# Patient Record
Sex: Male | Born: 1980 | Race: Black or African American | Hispanic: No | Smoking: Current every day smoker
Health system: Southern US, Community
[De-identification: ages and names within clinical notes are randomized; demographics above are authoritative.]

---

## 2008-12-09 ENCOUNTER — Emergency Department (HOSPITAL_COMMUNITY): Admission: EM | Admit: 2008-12-09 | Discharge: 2008-12-09 | Payer: Self-pay | Admitting: Emergency Medicine

## 2009-04-01 ENCOUNTER — Emergency Department (HOSPITAL_COMMUNITY): Admission: EM | Admit: 2009-04-01 | Discharge: 2009-04-01 | Payer: Self-pay | Admitting: Family Medicine

## 2010-01-08 ENCOUNTER — Emergency Department (HOSPITAL_COMMUNITY): Admission: EM | Admit: 2010-01-08 | Discharge: 2010-01-08 | Payer: Self-pay | Admitting: Family Medicine

## 2010-03-22 ENCOUNTER — Emergency Department (HOSPITAL_COMMUNITY): Admission: EM | Admit: 2010-03-22 | Discharge: 2010-03-22 | Payer: Self-pay | Admitting: Emergency Medicine

## 2010-08-27 ENCOUNTER — Inpatient Hospital Stay (HOSPITAL_COMMUNITY)
Admission: RE | Admit: 2010-08-27 | Discharge: 2010-08-27 | Disposition: A | Discharge status: Home / Self Care | Payer: Self-pay | Source: Ambulatory Visit | Attending: Emergency Medicine | Admitting: Emergency Medicine

## 2010-08-28 ENCOUNTER — Ambulatory Visit (INDEPENDENT_AMBULATORY_CARE_PROVIDER_SITE_OTHER): Payer: Self-pay

## 2010-08-28 ENCOUNTER — Inpatient Hospital Stay (INDEPENDENT_AMBULATORY_CARE_PROVIDER_SITE_OTHER)
Admission: RE | Admit: 2010-08-28 | Discharge: 2010-08-28 | Disposition: A | Discharge status: Home / Self Care | Payer: Self-pay | Source: Ambulatory Visit

## 2010-08-28 DIAGNOSIS — S9030XA Contusion of unspecified foot, initial encounter: Secondary | ICD-10-CM

## 2010-08-28 DIAGNOSIS — IMO0002 Reserved for concepts with insufficient information to code with codable children: Secondary | ICD-10-CM

## 2010-08-28 DIAGNOSIS — S335XXA Sprain of ligaments of lumbar spine, initial encounter: Secondary | ICD-10-CM

## 2010-12-02 ENCOUNTER — Emergency Department (HOSPITAL_COMMUNITY)
Admission: EM | Admit: 2010-12-02 | Discharge: 2010-12-03 | Disposition: A | Discharge status: Home / Self Care | Payer: Self-pay | Attending: Emergency Medicine | Admitting: Emergency Medicine

## 2010-12-02 DIAGNOSIS — K089 Disorder of teeth and supporting structures, unspecified: Secondary | ICD-10-CM | POA: Insufficient documentation

## 2010-12-02 DIAGNOSIS — K029 Dental caries, unspecified: Secondary | ICD-10-CM | POA: Insufficient documentation

## 2010-12-02 DIAGNOSIS — K047 Periapical abscess without sinus: Secondary | ICD-10-CM | POA: Insufficient documentation

## 2011-11-07 IMAGING — CR DG FOOT COMPLETE 3+V*R*
3 series · 3 of 3 positions shown · non-contrast
Comparison: None

CLINICAL DATA: Injured right foot.

RIGHT FOOT COMPLETE - 3+ VIEW

[view not recorded (1 of 3)]
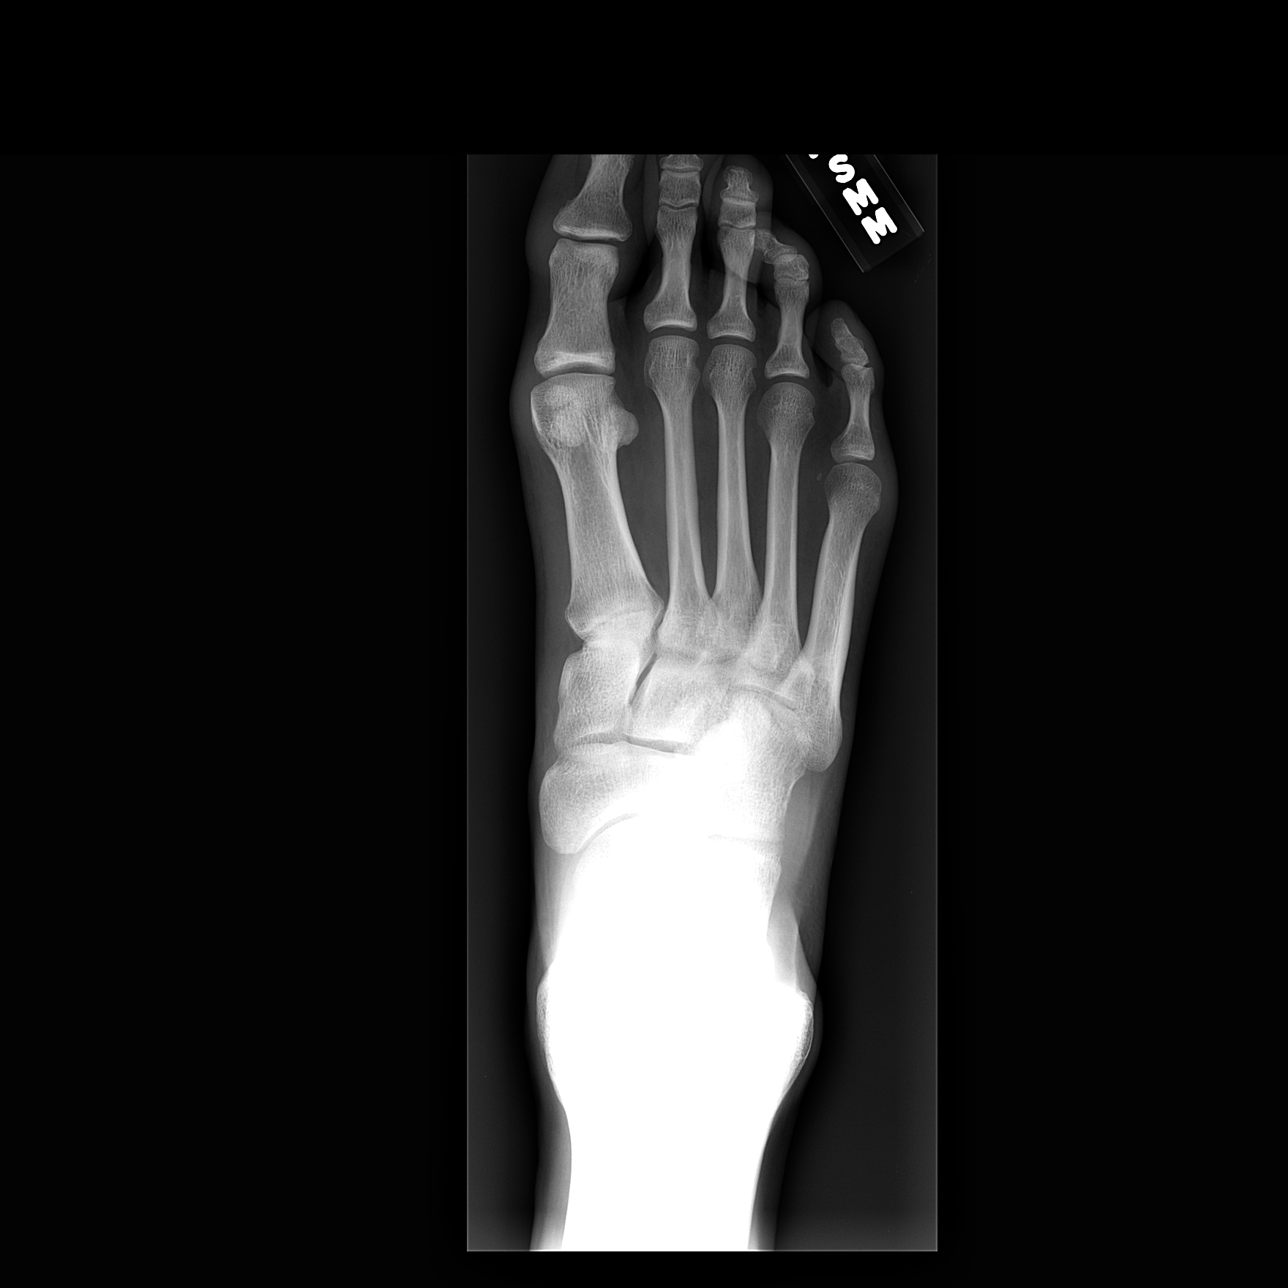

[view not recorded (2 of 3)]
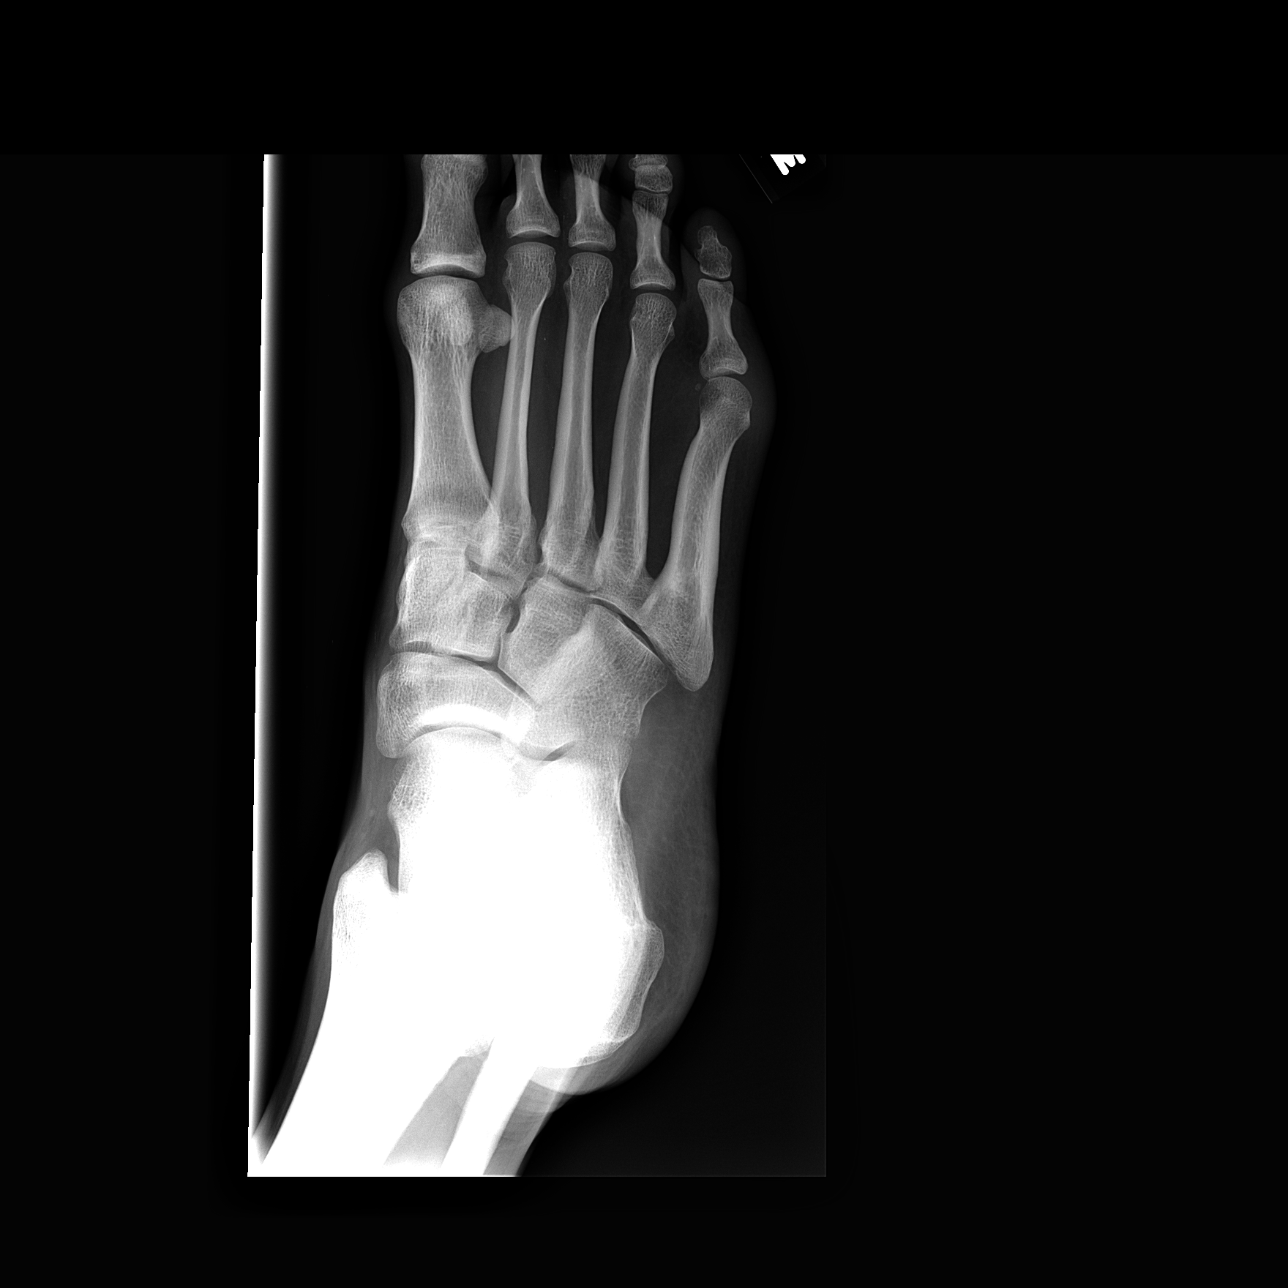

[view not recorded (3 of 3)]
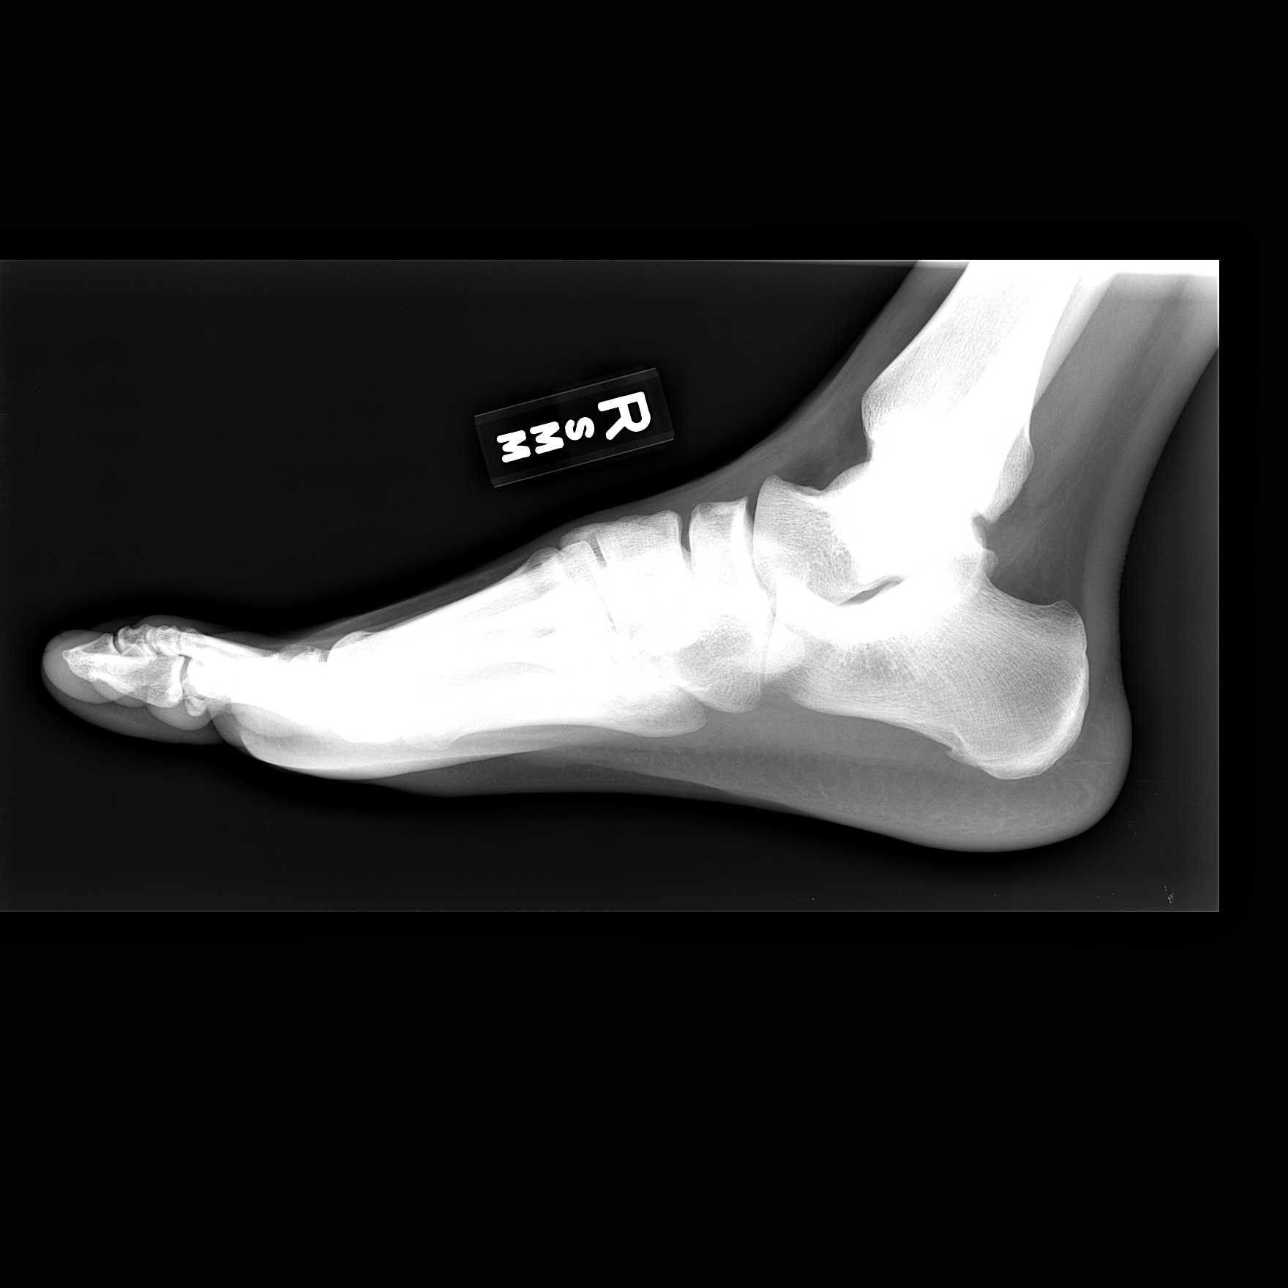

[3 of 3 positions shown; findings below may reference images not displayed]

FINDINGS: The joint spaces are maintained.  No acute fracture.
IMPRESSION: No acute bony findings.

## 2016-06-28 ENCOUNTER — Encounter (HOSPITAL_COMMUNITY): Payer: Self-pay | Admitting: *Deleted

## 2016-06-28 ENCOUNTER — Emergency Department (HOSPITAL_COMMUNITY)
Admission: EM | Admit: 2016-06-28 | Discharge: 2016-06-28 | Disposition: A | Discharge status: Home / Self Care | Payer: BLUE CROSS/BLUE SHIELD | Attending: Emergency Medicine | Admitting: Emergency Medicine

## 2016-06-28 DIAGNOSIS — Y990 Civilian activity done for income or pay: Secondary | ICD-10-CM | POA: Diagnosis not present

## 2016-06-28 DIAGNOSIS — Y939 Activity, unspecified: Secondary | ICD-10-CM | POA: Insufficient documentation

## 2016-06-28 DIAGNOSIS — Y9259 Other trade areas as the place of occurrence of the external cause: Secondary | ICD-10-CM | POA: Insufficient documentation

## 2016-06-28 DIAGNOSIS — F17290 Nicotine dependence, other tobacco product, uncomplicated: Secondary | ICD-10-CM | POA: Insufficient documentation

## 2016-06-28 DIAGNOSIS — Z79899 Other long term (current) drug therapy: Secondary | ICD-10-CM | POA: Diagnosis not present

## 2016-06-28 DIAGNOSIS — M546 Pain in thoracic spine: Secondary | ICD-10-CM | POA: Insufficient documentation

## 2016-06-28 DIAGNOSIS — X500XXA Overexertion from strenuous movement or load, initial encounter: Secondary | ICD-10-CM | POA: Diagnosis not present

## 2016-06-28 MED ORDER — NAPROXEN 500 MG PO TABS: 500.0000 mg | ORAL_TABLET | Freq: Two times a day (BID) | ORAL | 0 refills | Status: AC

## 2016-06-28 MED ORDER — KETOROLAC TROMETHAMINE 60 MG/2ML IM SOLN
60.0000 mg | Freq: Once | INTRAMUSCULAR | Status: AC
  Administered 2016-06-28: 60 mg via INTRAMUSCULAR
  Filled 2016-06-28: qty 2

## 2016-06-28 MED ORDER — METHOCARBAMOL 500 MG PO TABS: 500.0000 mg | ORAL_TABLET | Freq: Two times a day (BID) | ORAL | 0 refills | Status: AC

## 2016-06-28 MED ORDER — LIDOCAINE 5 % EX PTCH: 1.0000 | MEDICATED_PATCH | CUTANEOUS | 0 refills | Status: AC

## 2016-06-28 NOTE — ED Triage Notes (Signed)
Pt sts he is working physically demanding job which involves a lot of heavy lifting and he thinks he may have pulled a muscle. He c/o pain to right mid back radiating to lower back

## 2016-06-28 NOTE — Discharge Instructions (Signed)
Expect your soreness to increase over the next 2-3 days. Take it easy, but do not lay around too much as this may make the stiffness worse. Take 500 mg of naproxen every 12 hours or 800 mg of ibuprofen every 8 hours for the next 3 days. Take these medications with food to avoid upset stomach. Robaxin is a muscle relaxer and may help loosen stiff muscles. Do not take the Robaxin while driving or performing other dangerous activities. Be sure to perform the attached exercises starting with three times a week and working up to performing them daily. This is an essential part of preventing long term problems. Follow up with a primary care provider for any future management of these complaints. °

## 2016-06-28 NOTE — ED Provider Notes (Signed)
[facility]-EMERGENCY DEPT Provider Note   CSN: [id number] Arrival date & time: 06/28/16  1041  By signing my name below, I, Placido SouLogan Joldersma, attest that this documentation has been prepared under the direction and in the presence of Aamori Mcmasters C. Karry Causer, PA-C. Electronically Signed: Placido SouLogan Joldersma, ED Scribe. 06/28/16. 11:12 AM.   History   Chief Complaint Chief Complaint  Patient presents with  . Back Pain   HPI HPI Comments: Clayton Palmer is a 35 y.o. male who presents to the Emergency Department complaining of constant, moderate, gradual onset, right thoracic back pain x 1 week. Pt states he works in a [employer] environment doing heavy lifting throughout the day. His pain worsens with movement and when doing heavy lifting. He has taken ibuprofen prn w/o significant relief and additionally took a muscle relaxer last night which provided mild relief. Denies falls, neuro deficits, or any other complaints.     The history is provided by the patient and medical records. No language interpreter was used.    History reviewed. No pertinent past medical history.  There are no active problems to display for this patient.   History reviewed. No pertinent surgical history.     Home Medications    Prior to Admission medications   Medication Sig Start Date End Date Taking? Authorizing Provider  lidocaine (LIDODERM) 5 % Place 1 patch onto the skin daily. Remove & Discard patch within 12 hours or as directed by MD 06/28/16   Anselm PancoastShawn C Jasneet Schobert, PA-C  methocarbamol (ROBAXIN) 500 MG tablet Take 1 tablet (500 mg total) by mouth 2 (two) times daily. 06/28/16   Meeah Totino C Kinslea Frances, PA-C  naproxen (NAPROSYN) 500 MG tablet Take 1 tablet (500 mg total) by mouth 2 (two) times daily. 06/28/16   Anselm PancoastShawn C Christy Ehrsam, PA-C    Family History No family history on file.  Social History Social History  Substance Use Topics  . Smoking status: Current Every Day Smoker    Types: Cigars  . Smokeless tobacco: Never Used  . Alcohol  use Yes     Allergies   Patient has no allergy information on record.   Review of Systems Review of Systems  Musculoskeletal: Positive for back pain.  Skin: Negative for color change and wound.  Neurological: Negative for weakness and numbness.   Physical Exam Updated Vital Signs BP 130/88   Pulse 78   Temp 98.2 F (36.8 C) (Oral)   Resp 16   SpO2 99%   Physical Exam  Constitutional: He appears well-developed and well-nourished. No distress.  HENT:  Head: Normocephalic and atraumatic.  Eyes: Conjunctivae are normal.  Neck: Neck supple.  Cardiovascular: Normal rate and regular rhythm.   Pulmonary/Chest: Effort normal.  Musculoskeletal: He exhibits tenderness.  Tenderness to the right thoracic musculature around T4-T6 region. Normal motor function intact in all extremities and spine. No midline spinal tenderness.    Neurological: He is alert. He has normal strength. No sensory deficit.  No sensory deficits. Strength 5/5 in all extremities. No gait disturbance. Coordination intact.   Skin: Skin is warm and dry. He is not diaphoretic.  Psychiatric: He has a normal mood and affect. His behavior is normal.  Nursing note and vitals reviewed.  ED Treatments / Results  Labs (all labs ordered are listed, but only abnormal results are displayed) Labs Reviewed - No data to display  EKG  EKG Interpretation None       Radiology No results found.  Procedures Procedures  COORDINATION OF CARE: 11:11 AM Discussed  next steps with pt. Pt verbalized understanding and is agreeable with the plan.    Medications Ordered in ED Medications  ketorolac (TORADOL) injection 60 mg (60 mg Intramuscular Given 06/28/16 1134)     Initial Impression / Assessment and Plan / ED Course  I have reviewed the triage vital signs and the nursing notes.  Pertinent labs & imaging results that were available during my care of the patient were reviewed by me and considered in my medical decision  making (see chart for details).  Clinical Course     Patient presents with back pain consistent with muscular strain. He has no neuro or functional deficits. Home care and return precautions discussed.  I personally performed the services described in this documentation, which was scribed in my presence. The recorded information has been reviewed and is accurate.  Final Clinical Impressions(s) / ED Diagnoses   Final diagnoses:  Acute right-sided thoracic back pain    New Prescriptions Discharge Medication List as of 06/28/2016 11:17 AM    START taking these medications   Details  lidocaine (LIDODERM) 5 % Place 1 patch onto the skin daily. Remove & Discard patch within 12 hours or as directed by MD, Starting Mon 06/28/2016, Print    methocarbamol (ROBAXIN) 500 MG tablet Take 1 tablet (500 mg total) by mouth 2 (two) times daily., Starting Mon 06/28/2016, Print    naproxen (NAPROSYN) 500 MG tablet Take 1 tablet (500 mg total) by mouth 2 (two) times daily., Starting Mon 06/28/2016, Print         Anselm PancoastShawn C Mesiah Manzo, PA-C 06/29/16 1934    Lorre NickAnthony Allen, MD 06/29/16 [phone]

## 2021-09-17 ENCOUNTER — Emergency Department (HOSPITAL_COMMUNITY)
Admission: EM | Admit: 2021-09-17 | Discharge: 2021-09-17 | Disposition: A | Discharge status: Home / Self Care | Payer: BLUE CROSS/BLUE SHIELD | Attending: Emergency Medicine | Admitting: Emergency Medicine

## 2021-09-17 ENCOUNTER — Encounter (HOSPITAL_COMMUNITY): Payer: Self-pay | Admitting: Emergency Medicine

## 2021-09-17 DIAGNOSIS — H1132 Conjunctival hemorrhage, left eye: Secondary | ICD-10-CM | POA: Insufficient documentation

## 2021-09-17 MED ORDER — FLUORESCEIN SODIUM 1 MG OP STRP
1.0000 | ORAL_STRIP | Freq: Once | OPHTHALMIC | Status: AC
  Administered 2021-09-17: 21:00:00 1 via OPHTHALMIC
  Filled 2021-09-17: qty 1

## 2021-09-17 MED ORDER — OLOPATADINE HCL 0.1 % OP SOLN: 1.0000 [drp] | Freq: Two times a day (BID) | OPHTHALMIC | 0 refills | Status: AC

## 2021-09-17 MED ORDER — TETRACAINE HCL 0.5 % OP SOLN
2.0000 [drp] | Freq: Once | OPHTHALMIC | Status: AC
  Administered 2021-09-17: 21:00:00 2 [drp] via OPHTHALMIC
  Filled 2021-09-17: qty 4

## 2021-09-17 NOTE — ED Provider Notes (Signed)
?[facility] DEPT ?Provider Note ? ? ?CSN: [id number] ?Arrival date & time: 09/17/21  1858 ? ?  ? ?History ? ?Chief Complaint  ?Patient presents with  ? Eye Problem  ? ? ?Clayton Palmer is a 41 y.o. male with no segment past medical history who presents with concern for left eye pain, and hemorrhage that started 2 days ago.  Patient also reports that he has a history of allergies, some irritation in both eyes.  He reports that he has had some left-sided headaches preceded the eye pain.  Patient denies any injury or scratching of the left eye.  Patient denies any history of same.  He does not wear contacts, he does not take any blood thinners.  He denies any blood anywhere else.  He reports that he had some crusting of the eye this morning but no yellow purulent drainage. ? ? ?Eye Problem ?Associated symptoms: redness   ? ?  ? ?Home Medications ?Prior to Admission medications   ?Medication Sig Start Date End Date Taking? Authorizing Provider  ?olopatadine (PATANOL) 0.1 % ophthalmic solution Place 1 drop into both eyes 2 (two) times daily. 09/17/21  Yes Karlos Scadden H, PA-C  ?lidocaine (LIDODERM) 5 % Place 1 patch onto the skin daily. Remove & Discard patch within 12 hours or as directed by MD 06/28/16   Arlean Hopping C, PA-C  ?methocarbamol (ROBAXIN) 500 MG tablet Take 1 tablet (500 mg total) by mouth 2 (two) times daily. 06/28/16   Joy, Shawn C, PA-C  ?naproxen (NAPROSYN) 500 MG tablet Take 1 tablet (500 mg total) by mouth 2 (two) times daily. 06/28/16   Joy, Helane Gunther, PA-C  ?   ? ?Allergies    ?Patient has no known allergies.   ? ?Review of Systems   ?Review of Systems  ?Eyes:  Positive for pain and redness.  ?All other systems reviewed and are negative. ? ?Physical Exam ?Updated Vital Signs ?BP 118/74 (BP Location: Right Arm)   Pulse 64   Temp 98.2 ?F (36.8 ?C) (Oral)   Resp 18   Ht 6\' 9"  (2.057 m)   Wt 108.9 kg   SpO2 96%   BMI 25.72 kg/m?  ?Physical Exam ?Vitals and nursing  note reviewed.  ?Constitutional:   ?   General: He is not in acute distress. ?   Appearance: Normal appearance.  ?HENT:  ?   Head: Normocephalic and atraumatic.  ?Eyes:  ?   General:     ?   Right eye: No discharge.     ?   Left eye: No discharge.  ?   Comments: Patient with intact extraocular motion bilaterally.  Pupils are equal round reactive to light bilaterally.  There is a subconjunctival hemorrhage noted of the left medial portion of the left eye.  Does not extend into the iris.  There is no evidence of hyphema. ? ?There is no evidence of corneal abrasion with fluorescein lamp exam.  Patient with normal intraocular pressure on the left of 16 mmHg. ? ?Some decreased visual acuity  ?R Distance: 20/25 ?L Distance: 20/40 ? ?There is some injection, irritation of bilateral eyes, and the unaffected portion of the left eye. ?  ?Cardiovascular:  ?   Rate and Rhythm: Normal rate and regular rhythm.  ?Pulmonary:  ?   Effort: Pulmonary effort is normal. No respiratory distress.  ?Musculoskeletal:     ?   General: No deformity.  ?Skin: ?   General: Skin is warm and dry.  ?  Neurological:  ?   Mental Status: He is alert and oriented to person, place, and time.  ?Psychiatric:     ?   Mood and Affect: Mood normal.     ?   Behavior: Behavior normal.  ? ? ?ED Results / Procedures / Treatments   ?Labs ?(all labs ordered are listed, but only abnormal results are displayed) ?Labs Reviewed - No data to display ? ?EKG ?None ? ?Radiology ?No results found. ? ?Procedures ?Procedures  ? ? ?Medications Ordered in ED ?Medications  ?fluorescein ophthalmic strip 1 strip (1 strip Left Eye Given 09/17/21 2038)  ?tetracaine (PONTOCAINE) 0.5 % ophthalmic solution 2 drop (2 drops Left Eye Given 09/17/21 2038)  ? ? ?ED Course/ Medical Decision Making/ A&P ?  ?                        ?Medical Decision Making ?Risk ?Prescription drug management. ? ? ?I discussed this case with my attending physician who cosigned this note including patient's  presenting symptoms, physical exam, and planned diagnostics and interventions. Attending physician stated agreement with plan or made changes to plan which were implemented.  ? ?Attending physician assessed patient at bedside. ? ?Patient presents with acute left-sided eye injury.  Emergent differential diagnosis includes globe rupture, hyphema, ptyregion, acute angle-closure glaucoma, traumatic iritis versus other.  This is not an exhaustive differential.  Patient with history of allergies, reports that he had some small irritation of the eyes today.  However he denies any trauma to the left eye.  He has clinical appearance of a subconjunctival hemorrhage.  There is no evidence of hyphema or spread into the iris.  Patient with reactive pupils, intact extraocular motions.  He has no evidence of corneal abrasion with fluorescein lamp exam, and he has normal intraocular pressure. ? ?Discussed with patient this is a self-limited diagnosis which should improve on its own with some time.  Given his history of allergies, and some irritation in both eyes.  Encouraged lubricating eyedrops, as well as will provide antihistamine eyedrops to help with irritation.  I do not see clinical evidence of a bacterial infection.  Additionally patient has no signs of preseptal or orbital cellulitis at this time.  Patient given follow-up information for ophthalmology, and discharged in stable condition at this time, return precautions given. ?Final Clinical Impression(s) / ED Diagnoses ?Final diagnoses:  ?Subconjunctival hemorrhage, left  ? ? ?Rx / DC Orders ?ED Discharge Orders   ? ?      Ordered  ?  olopatadine (PATANOL) 0.1 % ophthalmic solution  2 times daily       ? 09/17/21 2134  ? ?  ?  ? ?  ? ? ?  ?Anselmo Pickler, PA-C ?09/17/21 2210 ? ?  ?Charlesetta Shanks, MD ?09/18/21 1517 ? ?

## 2021-09-17 NOTE — Discharge Instructions (Addendum)
As we discussed your left eye has the appearance of a subconjunctival hemorrhage which is bleeding under the surface of the cornea which should resolve on its own over time.  It may spread before it starts resolved.  At this time you do not have any evidence of a corneal abrasion or elevated intraocular pressure.  I still recommend that you follow-up with ophthalmology to ensure resolution.  In the meantime as you are endorsing some allergies, and irritation of bilateral eyes I will prescribe you some antihistamine eyedrops to try, in addition I recommend that you take allergy medication such as Zyrtec over-the-counter.  Additionally can use some lubricating eyedrops more than the twice a day dictated in the antihistamine drops. ?

## 2021-09-17 NOTE — ED Triage Notes (Signed)
Pt reports L eye pain and pressure behind that eye. He states that the pressure causes headaches. Symptoms started 2 days ago. Sclera is blood shot. Denies vision changes. Does endorse some drainage out of that L eye.  ?
